# Patient Record
Sex: Male | Born: 1990 | Race: White | Hispanic: No | Smoking: Never smoker
Health system: Southern US, Community
[De-identification: ages and names within clinical notes are randomized; demographics above are authoritative.]

---

## 2008-08-20 ENCOUNTER — Encounter: Admission: RE | Admit: 2008-08-20 | Discharge: 2008-08-20 | Payer: Self-pay | Admitting: Family Medicine

## 2011-02-20 IMAGING — CR DG ABDOMEN 2V
4 series · 4 of 4 positions shown · non-contrast
Comparison: None

CLINICAL DATA: Abdominal pain for 1 year with history constipation.

ABDOMEN - 2 VIEW

[view not recorded (1 of 4)]
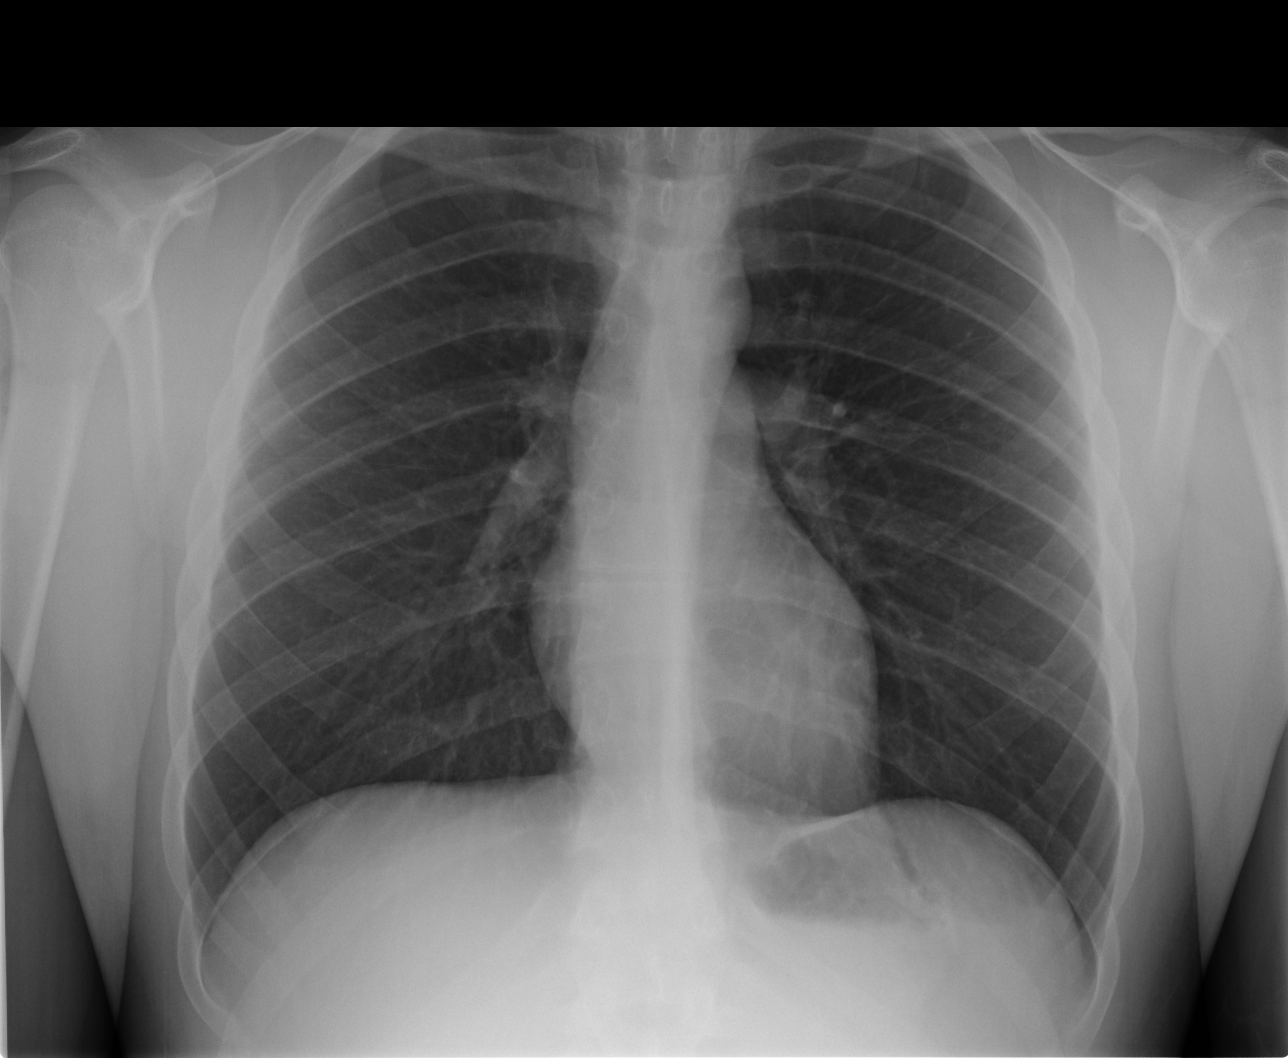

[view not recorded (2 of 4)]
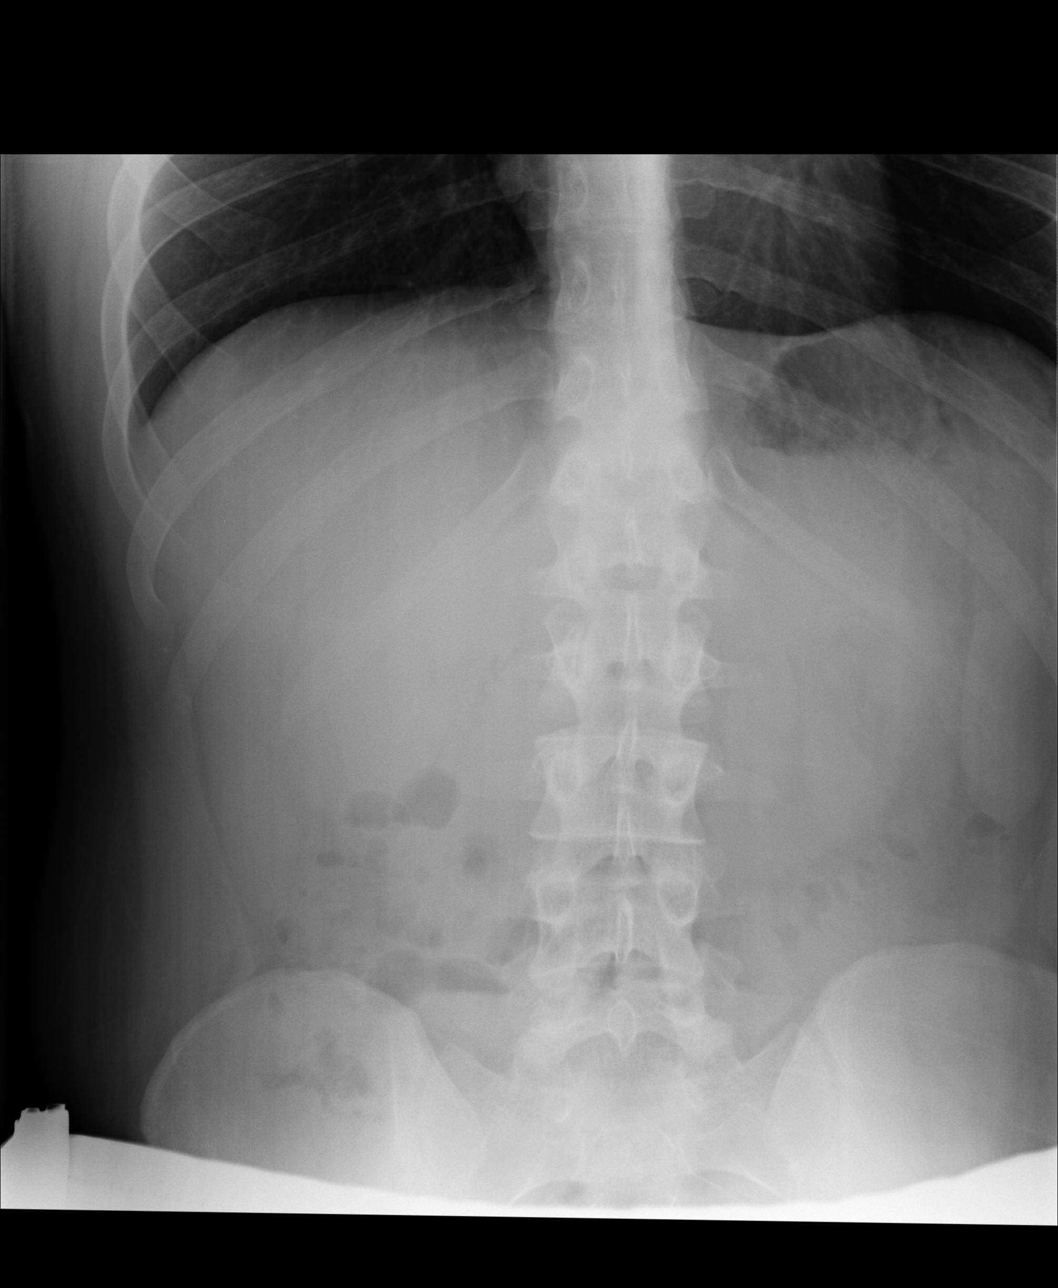

[view not recorded (3 of 4)]
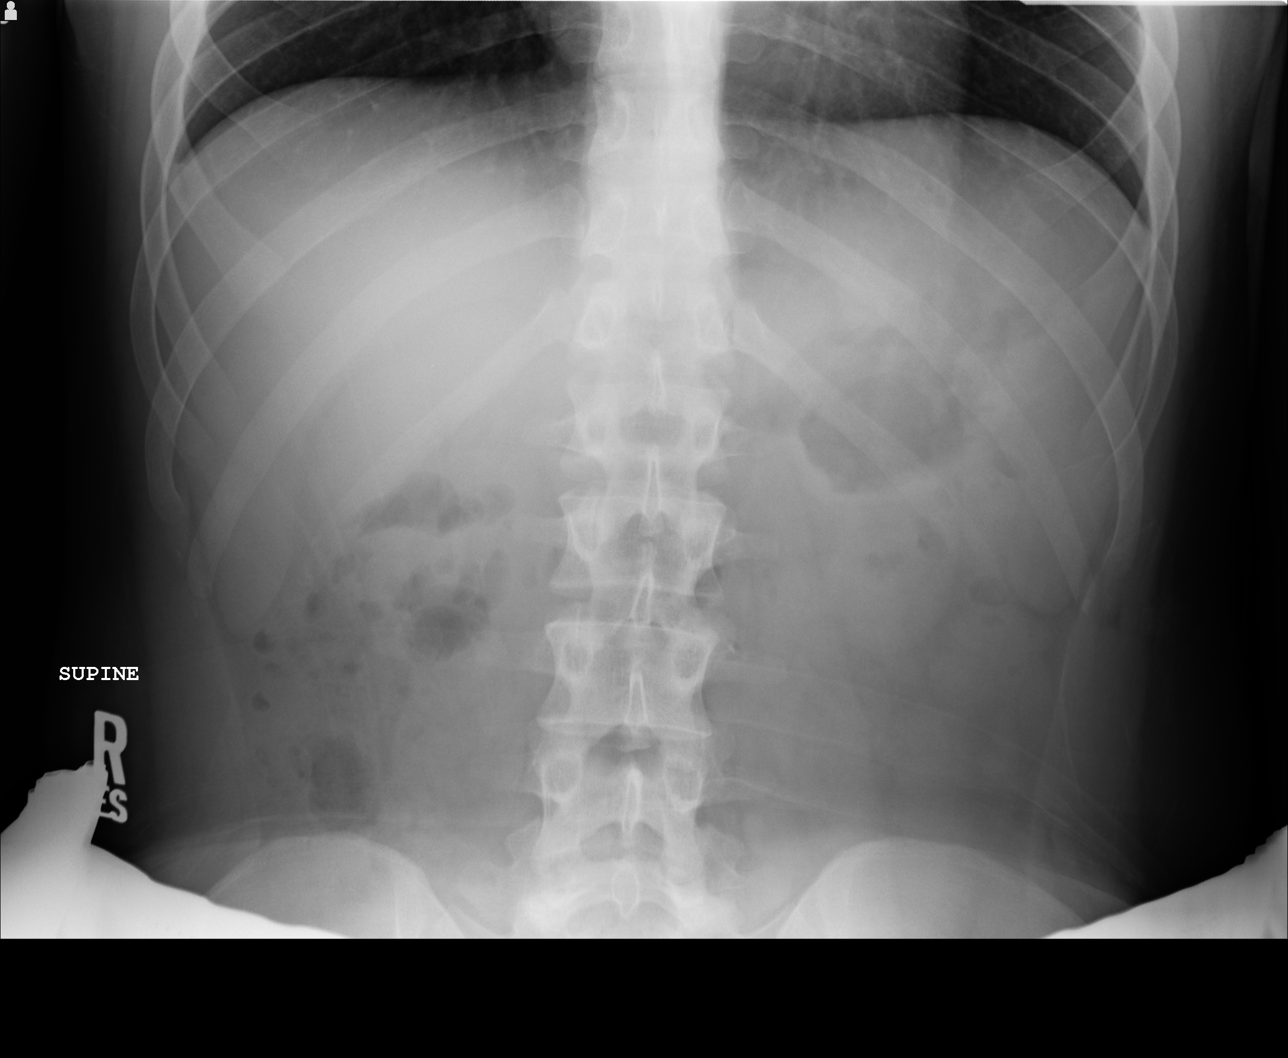

[view not recorded (4 of 4)]
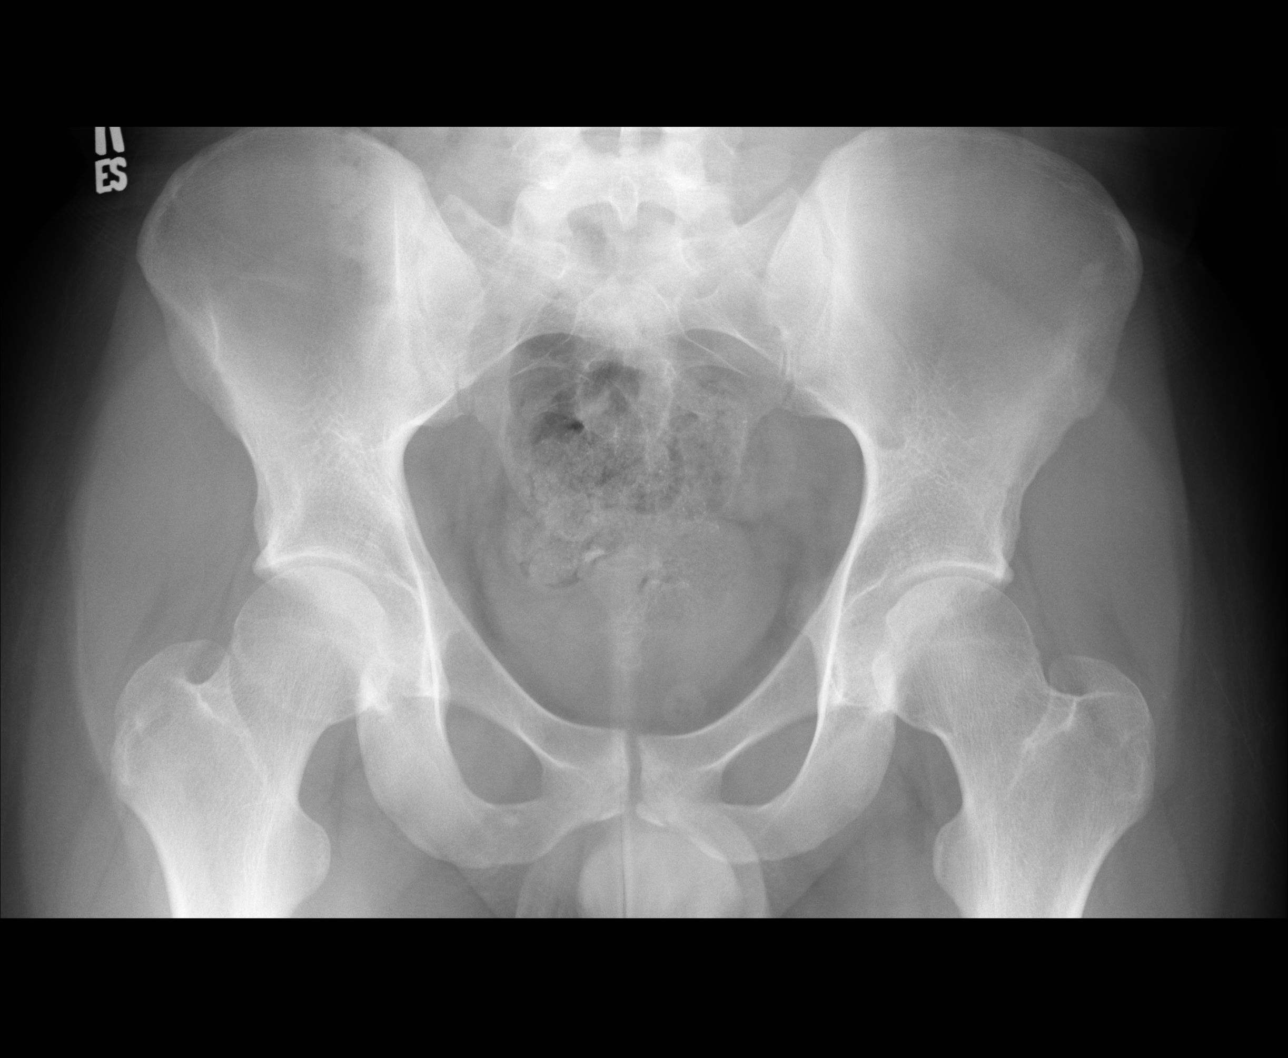

[4 of 4 positions shown; findings below may reference images not displayed]

FINDINGS: There is no evidence of dilated bowel loops or free
intraperitoneal air.  No radiopaque calculi or other significant
radiographic abnormality is seen. Heart size and mediastinal
contours are within normal limits.  Both lungs are clear.
IMPRESSION: Negative abdominal radiographs.  No acute cardiopulmonary disease.

## 2021-09-01 ENCOUNTER — Ambulatory Visit (INDEPENDENT_AMBULATORY_CARE_PROVIDER_SITE_OTHER): Payer: 59 | Admitting: Neurology

## 2021-09-01 ENCOUNTER — Encounter: Payer: Self-pay | Admitting: Neurology

## 2021-09-01 VITALS — BP 163/84 | HR 83 | Ht 68.0 in | Wt 168.0 lb

## 2021-09-01 DIAGNOSIS — R202 Paresthesia of skin: Secondary | ICD-10-CM | POA: Diagnosis not present

## 2021-09-01 NOTE — Progress Notes (Signed)
Chief Complaint  Patient presents with   New Patient (Initial Visit)    Room 13, alone Paresthesia on the side of face, sx started last nov 2023      ASSESSMENT AND PLAN  Tony Mullins is a 31 y.o. male   Right V2, V3 shingle in December 2022 Diminished bilateral face paresthesia involving both V2 V3 distribution  He denies significant pain, no limitation in his daily function,  Not sure exact etiology, if he does have structural abnormality, tends to stay on one side instead of symmetric, bilateral involvement, history also does not consistent with typical postherpetic neuralgia  Discussed with patient, decided to continue observe his symptoms, call back for worsening    DIAGNOSTIC DATA (LABS, IMAGING, TESTING) - I reviewed patient records, labs, notes, testing and imaging myself where available.   MEDICAL HISTORY:  Tony Mullins is a 31 year old right-handed male, seen in request by his primary care physician Dr. Tally Joe, for evaluation of intermittent bilateral face paresthesia   I reviewed and summarized the referring note. PMHX. HTN  He was diagnosed with his right face shingle in December 2023, had a vesicles paresthesia burning pain involving right V2, V3 branch, also has  vesicles noted in oral cavity, was treated with Valtrex and prednisone, symptoms quickly improved  However since then, he has intermittent paresthesia in the same distribution on the right face, few weeks later, also noticed similar sensation on his left face, never had rash broke out on his left side, more of a nuisance for him  He denies limitation on his function, does have some anxiety, especially with his new onset symptoms    PHYSICAL EXAM:   Vitals:   09/01/21 1426  BP: (!) 163/84  Pulse: 83  Weight: 168 lb (76.2 kg)  Height: 5\' 8"  (1.727 m)   Not recorded     Body mass index is 25.54 kg/m.  PHYSICAL EXAMNIATION:  Gen: NAD, conversant, well nourised, well groomed                      Cardiovascular: Regular rate rhythm, no peripheral edema, warm, nontender. Eyes: Conjunctivae clear without exudates or hemorrhage Neck: Supple, no carotid bruits. Pulmonary: Clear to auscultation bilaterally   NEUROLOGICAL EXAM:  MENTAL STATUS: Speech/cognition: Awake, alert, oriented to history taking and casual conversation CRANIAL NERVES: CN II: Visual fields are full to confrontation. Pupils are round equal and briskly reactive to light. CN III, IV, VI: extraocular movement are normal. No ptosis. CN V: Facial sensation is intact to light touch, bilateral corneal reflex were symmetric CN VII: Face is symmetric with normal eye closure  CN VIII: Hearing is normal to causal conversation. CN IX, X: Phonation is normal. CN XI: Head turning and shoulder shrug are intact  MOTOR: There is no pronator drift of out-stretched arms. Muscle bulk and tone are normal. Muscle strength is normal.  REFLEXES: Reflexes are 2+ and symmetric at the biceps, triceps, knees, and ankles. Plantar responses are flexor.  SENSORY: Intact to light touch, pinprick and vibratory sensation are intact in fingers and toes.  COORDINATION: There is no trunk or limb dysmetria noted.  GAIT/STANCE: Posture is normal. Gait is steady with normal steps, base, arm swing, and turning. Heel and toe walking are normal. Tandem gait is normal.  Romberg is absent.  REVIEW OF SYSTEMS:  Full 14 system review of systems performed and notable only for as above All other review of systems were negative.   ALLERGIES: No  Known Allergies  HOME MEDICATIONS: Current Outpatient Medications  Medication Sig Dispense Refill   olmesartan (BENICAR) 5 MG tablet Take 5 mg by mouth daily.     No current facility-administered medications for this visit.    PAST MEDICAL HISTORY: No past medical history on file.  PAST SURGICAL HISTORY:  FAMILY HISTORY: No family history on file.  SOCIAL HISTORY: Social  History   Socioeconomic History   Marital status: Unknown    Spouse name: Not on file   Number of children: Not on file   Years of education: Not on file   Highest education level: Not on file  Occupational History   Not on file  Tobacco Use   Smoking status: Not on file   Smokeless tobacco: Not on file  Substance and Sexual Activity   Alcohol use: Not on file   Drug use: Not on file   Sexual activity: Not on file  Other Topics Concern   Not on file  Social History Narrative   Not on file   Social Determinants of Health   Financial Resource Strain: Not on file  Food Insecurity: Not on file  Transportation Needs: Not on file  Physical Activity: Not on file  Stress: Not on file  Social Connections: Not on file  Intimate Partner Violence: Not on file      Levert Feinstein, M.D. Ph.D.  [facility] [address] [place], [place] [address] Ph: [phone] Fax: [phone]  CC:  Tally Joe, MD [address] [place],  [place] [address]  Tally Joe, MD

## 2023-02-28 ENCOUNTER — Ambulatory Visit: Payer: 59 | Admitting: Neurology

## 2023-02-28 ENCOUNTER — Encounter: Payer: Self-pay | Admitting: Neurology

## 2023-02-28 VITALS — BP 177/91 | HR 82 | Ht 68.0 in | Wt 176.0 lb

## 2023-02-28 DIAGNOSIS — R202 Paresthesia of skin: Secondary | ICD-10-CM | POA: Diagnosis not present

## 2023-02-28 NOTE — Progress Notes (Signed)
 Chief Complaint  Patient presents with   Room 15    Pt is here Alone. Pt states that the left side of his face will feel like water dripping down his face that will last a month or 2 and then it would go away. Pt states that he has dizziness lately. Pt states that he has a hard time focus with his eyes. Pt states that he used to get headaches and would have pressure behind his eyes.       ASSESSMENT AND PLAN  Tony Mullins is a 33 y.o. male   History of right V2 V3 shingles, Recurrent left facial paresthesia  In the territory of left V1 territory, but no rash broke out,  He worry about the structural abnormality will proceed with MRI of the brain with without contrast  DIAGNOSTIC DATA (LABS, IMAGING, TESTING) - I reviewed patient records, labs, notes, testing and imaging myself where available.   MEDICAL HISTORY:  Tony Mullins is a 33 year old male, seen in request by his primary care doctor Tally Joe, for evaluation of left facial numbness, initial evaluation February 28, 2023    History is obtained from the patient and review of electronic medical records. I personally reviewed pertinent available imaging films in PACS.   PMHx of  Vaper nicotine occasionally,   Over past 2 months, he felt intermittent left facial numbness, as if liquid is dripping down the left side of his face, mainly involving left above eye region, no pain, no rash broke out, he denies skin allodynia  His current symptoms remind him of right facial shingle episode in 2022, but he did have rash broke out at his right forehead, upper and lower eyelid, was very painful when he experienced shingle then, he did receive antivirals treatment and also steroid, paresthesia lingered for a while, eventually improved,  He denies hearing loss, no visual change PHYSICAL EXAM:   Vitals:   02/28/23 1506  BP: (!) 177/91  Pulse: 82  Weight: 176 lb (79.8 kg)  Height: 5\' 8"  (1.727 m)   Not recorded      Body mass index is 26.76 kg/m.  PHYSICAL EXAMNIATION:  Gen: NAD, conversant, well nourised, well groomed                     Cardiovascular: Regular rate rhythm, no peripheral edema, warm, nontender. Eyes: Conjunctivae clear without exudates or hemorrhage Neck: Supple, no carotid bruits. Pulmonary: Clear to auscultation bilaterally   NEUROLOGICAL EXAM:  MENTAL STATUS: Speech/cognition: Awake, alert, oriented to history taking and casual conversation CRANIAL NERVES: CN II: Visual fields are full to confrontation. Pupils are round equal and briskly reactive to light. CN III, IV, VI: extraocular movement are normal. No ptosis. CN V: Facial sensation is intact to light touch, bilateral corneal reflex were normal and symmetric CN VII: Face is symmetric with normal eye closure  CN VIII: Hearing is normal to causal conversation. CN IX, X: Phonation is normal. CN XI: Head turning and shoulder shrug are intact  MOTOR: There is no pronator drift of out-stretched arms. Muscle bulk and tone are normal. Muscle strength is normal.  REFLEXES: Reflexes are 2+ and symmetric at the biceps, triceps, knees, and ankles. Plantar responses are flexor.  SENSORY: Intact to light touch, pinprick and vibratory sensation are intact in fingers and toes.  COORDINATION: There is no trunk or limb dysmetria noted.  GAIT/STANCE: Posture is normal. Gait is steady with normal steps, base, arm swing, and turning. Heel and  toe walking are normal. Tandem gait is normal.  Romberg is absent.  REVIEW OF SYSTEMS:  Full 14 system review of systems performed and notable only for as above All other review of systems were negative.   ALLERGIES: No Known Allergies  HOME MEDICATIONS: Current Outpatient Medications  Medication Sig Dispense Refill   olmesartan (BENICAR) 5 MG tablet Take 5 mg by mouth daily.     No current facility-administered medications for this visit.    PAST MEDICAL HISTORY: History  reviewed. No pertinent past medical history.  PAST SURGICAL HISTORY: History reviewed. No pertinent surgical history.  FAMILY HISTORY: History reviewed. No pertinent family history.  SOCIAL HISTORY: Social History   Socioeconomic History   Marital status: Unknown    Spouse name: Not on file   Number of children: Not on file   Years of education: Not on file   Highest education level: Not on file  Occupational History   Not on file  Tobacco Use   Smoking status: Never   Smokeless tobacco: Never  Vaping Use   Vaping status: Some Days   Substances: Nicotine  Substance and Sexual Activity   Alcohol use: Yes    Comment: Socially   Drug use: Never   Sexual activity: Yes    Birth control/protection: None  Other Topics Concern   Not on file  Social History Narrative   Right Handed   Occasional Soda   Social Drivers of Health   Financial Resource Strain: Not on file  Food Insecurity: Not on file  Transportation Needs: Not on file  Physical Activity: Not on file  Stress: Not on file  Social Connections: Not on file  Intimate Partner Violence: Not on file      Levert Feinstein, M.D. Ph.D.  [facility] [address] [place], [place] [address] Ph: [phone] Fax: [phone]  CC:  Tally Joe, MD [address] [place],  [place] [address]  Tally Joe, MD
